# Patient Record
Sex: Male | Born: 1990
Health system: Southern US, Community
[De-identification: ages and names within clinical notes are randomized; demographics above are authoritative.]

## PROBLEM LIST (undated history)

## (undated) DIAGNOSIS — K529 Noninfective gastroenteritis and colitis, unspecified: Secondary | ICD-10-CM

## (undated) DIAGNOSIS — F32A Depression, unspecified: Secondary | ICD-10-CM

## (undated) DIAGNOSIS — G4730 Sleep apnea, unspecified: Secondary | ICD-10-CM

## (undated) HISTORY — PX: NO PAST SURGERIES: SHX2092

---

## 2020-10-15 ENCOUNTER — Ambulatory Visit: Admit: 2020-10-15 | Discharge status: Against Medical Advice | Payer: Self-pay

## 2020-10-24 ENCOUNTER — Ambulatory Visit
Admission: EM | Admit: 2020-10-24 | Discharge: 2020-10-24 | Disposition: A | Discharge status: Home / Self Care | Payer: BC Managed Care – PPO

## 2020-10-24 ENCOUNTER — Other Ambulatory Visit: Payer: Self-pay

## 2020-10-24 ENCOUNTER — Ambulatory Visit (INDEPENDENT_AMBULATORY_CARE_PROVIDER_SITE_OTHER): Payer: BC Managed Care – PPO

## 2020-10-24 DIAGNOSIS — J069 Acute upper respiratory infection, unspecified: Secondary | ICD-10-CM

## 2020-10-24 DIAGNOSIS — H66002 Acute suppurative otitis media without spontaneous rupture of ear drum, left ear: Secondary | ICD-10-CM

## 2020-10-24 MED ORDER — AMOXICILLIN-POT CLAVULANATE 875-125 MG PO TABS: 1.0000 | ORAL_TABLET | Freq: Two times a day (BID) | ORAL | 0 refills | Status: AC

## 2020-10-24 MED ORDER — PROMETHAZINE-DM 6.25-15 MG/5ML PO SYRP: 5.0000 mL | ORAL_SOLUTION | Freq: Four times a day (QID) | ORAL | 0 refills | Status: DC | PRN

## 2020-10-24 NOTE — ED Provider Notes (Signed)
[facility]    CSN: [id number] Arrival date & time: 10/24/20  1827      History   Chief Complaint Chief Complaint  Patient presents with  . Cough    HPI Bradley Ruiz is a 29 y.o. male.   29 year old male here for evaluation of cough, sneezing, runny nose, and nasal congestion.  Patient reports that his symptoms have been going on for the past 10 days.  He denies fever, nausea, vomiting, diarrhea, or shortness of breath.  He has been around someone with similar symptoms which was his child.  He reports that his cough is dry but it is to the point where he is waking everybody up at night with his coughing.  He reports that he has had some wheezing as well.  Patient was tested for Covid earlier in the week and was negative.  He has been fully vaccinated against Covid.     History reviewed. No pertinent past medical history.  There are no problems to display for this patient.   Past Surgical History:  Procedure Laterality Date  . NO PAST SURGERIES         Home Medications    Prior to Admission medications   Medication Sig Start Date End Date Taking? Authorizing Provider  FLUoxetine (PROZAC) 20 MG capsule Take 20 mg by mouth daily. 10/01/20  Yes [provider]  amoxicillin-clavulanate (AUGMENTIN) 875-125 MG tablet Take 1 tablet by mouth every 12 (twelve) hours for 10 days. 10/24/20 11/03/20  Becky Augusta, NP  promethazine-dextromethorphan (PROMETHAZINE-DM) 6.25-15 MG/5ML syrup Take 5 mLs by mouth 4 (four) times daily as needed. 10/24/20   Becky Augusta, NP    Family History Family History  Problem Relation Age of Onset  . Hypertension Father     Social History Social History   Tobacco Use  . Smoking status: Never Smoker  . Smokeless tobacco: Never Used  Vaping Use  . Vaping Use: Never used  Substance Use Topics  . Alcohol use: Yes    Comment: rare  . Drug use: Never     Allergies   Patient has no known allergies.   Review of  Systems Review of Systems  Constitutional: Negative for activity change, appetite change and fever.  HENT: Positive for rhinorrhea and sneezing. Negative for congestion, ear discharge, ear pain, sinus pressure, sinus pain and sore throat.   Respiratory: Positive for cough and wheezing. Negative for shortness of breath.   Cardiovascular: Negative for chest pain.  Gastrointestinal: Negative for diarrhea and vomiting.  Musculoskeletal: Negative for arthralgias and myalgias.  Neurological: Negative for headaches.  Hematological: Negative.   Psychiatric/Behavioral: Negative.      Physical Exam Triage Vital Signs ED Triage Vitals  Enc Vitals Group     BP 10/24/20 1901 133/82     Pulse Rate 10/24/20 1901 76     Resp 10/24/20 1901 19     Temp 10/24/20 1901 98.1 F (36.7 C)     Temp Source 10/24/20 1901 Oral     SpO2 10/24/20 1901 99 %     Weight 10/24/20 1857 (!) 400 lb (181.4 kg)     Height 10/24/20 1857 6\' 2"  (1.88 m)     Head Circumference --      Peak Flow --      Pain Score 10/24/20 1857 0     Pain Loc --      Pain Edu? --      Excl. in [place]? --    No data found.  Updated Vital Signs BP 133/82 (BP Location: Right Arm)   Pulse 76   Temp 98.1 F (36.7 C) (Oral)   Resp 19   Ht 6\' 2"  (1.88 m)   Wt (!) 400 lb (181.4 kg)   SpO2 99%   BMI 51.36 kg/m   Visual Acuity Right Eye Distance:   Left Eye Distance:   Bilateral Distance:    Right Eye Near:   Left Eye Near:    Bilateral Near:     Physical Exam Vitals and nursing note reviewed.  Constitutional:      General: He is not in acute distress.    Appearance: Normal appearance. He is obese. He is not toxic-appearing.  HENT:     Head: Normocephalic and atraumatic.     Right Ear: Tympanic membrane, ear canal and external ear normal.     Left Ear: Ear canal and external ear normal.     Ears:     Comments: Left tympanic membrane is erythematous and injected.    Nose: Congestion and rhinorrhea present.     Comments:  Nasal mucosa have mild erythema and edema with clear nasal discharge.    Mouth/Throat:     Mouth: Mucous membranes are moist.     Pharynx: Posterior oropharyngeal erythema present. No oropharyngeal exudate.     Comments: Posterior oropharynx has mild erythema and clear postnasal drip. Eyes:     General: No scleral icterus.    Extraocular Movements: Extraocular movements intact.     Conjunctiva/sclera: Conjunctivae normal.     Pupils: Pupils are equal, round, and reactive to light.  Cardiovascular:     Rate and Rhythm: Normal rate and regular rhythm.     Pulses: Normal pulses.     Heart sounds: Normal heart sounds. No murmur heard.  No gallop.   Pulmonary:     Effort: Pulmonary effort is normal.     Breath sounds: Normal breath sounds. No wheezing or rales.  Musculoskeletal:        General: No tenderness. Normal range of motion.     Cervical back: Normal range of motion and neck supple. No tenderness.  Skin:    General: Skin is warm and dry.     Capillary Refill: Capillary refill takes less than 2 seconds.     Findings: No erythema or rash.  Neurological:     General: No focal deficit present.     Mental Status: He is alert and oriented to person, place, and time.  Psychiatric:        Mood and Affect: Mood normal.        Behavior: Behavior normal.        Thought Content: Thought content normal.        Judgment: Judgment normal.      UC Treatments / Results  Labs (all labs ordered are listed, but only abnormal results are displayed) Labs Reviewed - No data to display  EKG   Radiology DG Chest 2 View  Result Date: 10/24/2020 CLINICAL DATA:  Cough 1 week EXAM: CHEST - 2 VIEW COMPARISON:  None. FINDINGS: The heart size and mediastinal contours are within normal limits. Both lungs are clear. The visualized skeletal structures are unremarkable. IMPRESSION: No active cardiopulmonary disease. Electronically Signed   By: 13/04/2020 M.D.   On: 10/24/2020 19:21     Procedures Procedures (including critical care time)  Medications Ordered in UC Medications - No data to display  Initial Impression / Assessment and Plan / UC Course  I  have reviewed the triage vital signs and the nursing notes.  Pertinent labs & imaging results that were available during my care of the patient were reviewed by me and considered in my medical decision making (see chart for details).   Patient is here for evaluation of a cough and cold symptoms that he had for the past 10 days.  He says he is feeling better and his major problem is his cough.  He says his cough is so violent at night that it wakes the rest of his family up.  Physical exam reveals some mildly erythematous nasal mucosa with clear nasal discharge and clear postnasal drip.  His left tympanic membrane is erythematous and injected.  Will treat for otitis media with Augmentin twice daily x10 days.  Lung sounds are clear to auscultation.  Will give Promethazine DM for cough and congestion at nighttime.  Patient can follow-up with his primary care provider if his symptoms are not improving.  Nursing order a chest x-ray which was negative.   Final Clinical Impressions(s) / UC Diagnoses   Final diagnoses:  Viral URI with cough  Non-recurrent acute suppurative otitis media of left ear without spontaneous rupture of tympanic membrane     Discharge Instructions     Take the Augmentin twice daily for 10 days.  Take it with food to treat your ear infection.  Use the Promethazine DM every 6 hours as needed for cough.  This will make you drowsy so I suggest you save it for bedtime.  Increase your oral fluid intake to keep your mucus nice and thin.  If your symptoms continue follow-up with your primary care provider.    ED Prescriptions    Medication Sig Dispense Auth. Provider   amoxicillin-clavulanate (AUGMENTIN) 875-125 MG tablet Take 1 tablet by mouth every 12 (twelve) hours for 10 days. 20 tablet Becky Augusta, NP   promethazine-dextromethorphan (PROMETHAZINE-DM) 6.25-15 MG/5ML syrup Take 5 mLs by mouth 4 (four) times daily as needed. 118 mL Becky Augusta, NP     PDMP not reviewed this encounter.   Becky Augusta, NP 10/24/20 1931

## 2020-10-24 NOTE — Discharge Instructions (Addendum)
Take the Augmentin twice daily for 10 days.  Take it with food to treat your ear infection.  Use the Promethazine DM every 6 hours as needed for cough.  This will make you drowsy so I suggest you save it for bedtime.  Increase your oral fluid intake to keep your mucus nice and thin.  If your symptoms continue follow-up with your primary care provider.

## 2020-10-24 NOTE — ED Triage Notes (Signed)
Patient complains of cough, sneezing, runny nose, nasal congestion x 10 days. States that he was covid tested on Friday and was negative. States that he was worse and feels a little better now but feels some chest tightness when coughing.

## 2020-12-01 ENCOUNTER — Ambulatory Visit
Admission: EM | Admit: 2020-12-01 | Discharge: 2020-12-01 | Disposition: A | Discharge status: Home / Self Care | Payer: BC Managed Care – PPO | Attending: Emergency Medicine | Admitting: Emergency Medicine

## 2020-12-01 ENCOUNTER — Other Ambulatory Visit: Payer: Self-pay

## 2020-12-01 DIAGNOSIS — R059 Cough, unspecified: Secondary | ICD-10-CM

## 2020-12-01 MED ORDER — PROMETHAZINE-DM 6.25-15 MG/5ML PO SYRP: 5.0000 mL | ORAL_SOLUTION | Freq: Four times a day (QID) | ORAL | 0 refills | Status: DC | PRN

## 2020-12-01 MED ORDER — ALBUTEROL SULFATE HFA 108 (90 BASE) MCG/ACT IN AERS: 2.0000 | INHALATION_SPRAY | RESPIRATORY_TRACT | 0 refills | Status: DC | PRN

## 2020-12-01 MED ORDER — AEROCHAMBER MV MISC: 2 refills | Status: DC

## 2020-12-01 MED ORDER — BENZONATATE 100 MG PO CAPS: 200.0000 mg | ORAL_CAPSULE | Freq: Three times a day (TID) | ORAL | 0 refills | Status: DC

## 2020-12-01 MED ORDER — DOXYCYCLINE HYCLATE 100 MG PO CAPS: 100.0000 mg | ORAL_CAPSULE | Freq: Two times a day (BID) | ORAL | 0 refills | Status: DC

## 2020-12-01 NOTE — ED Triage Notes (Signed)
Patient states that he is here for ear pain that started around the end of October. Patient states that he was seen here on 11/5 and was rx'd Augmentin. States that his wife is deathly allergic to this and would not take it at home. States that he has been continuing to have pain and feels like when he breathes in he hears crackles.

## 2020-12-01 NOTE — Discharge Instructions (Addendum)
Take the doxycycline twice a day, with food, for 10 days.  Use the Tessalon Perles during the day as needed for cough and the Promethazine DM at bedtime.  Use the albuterol inhaler, 2 puffs with the spacer, every 4-6 hours as needed for cough and shortness of breath.  If your symptoms continue follow-up with your primary care provider.

## 2020-12-01 NOTE — ED Provider Notes (Signed)
[facility]    CSN: [id number] Arrival date & time: 12/01/20  1759      History   Chief Complaint Chief Complaint  Patient presents with  . Ear Pain    Left     HPI Bradley Ruiz is a 29 y.o. male.   HPI   29 year old male here for evaluation of continued cough.  Patient has had symptoms since the end of October.  He was evaluated here on November 5 and was diagnosed with left otitis media and a viral URI with cough.  Patient was prescribed Augmentin but he did not take it because his wife did not want him taking it at home because she is allergic to penicillin.  Patient denies ear pain or fever.  Patient states he does have some shortness of breath but nothing above his baseline.  Patient denies wheezing.  Patient has had both nasal and chest congestion.  History reviewed. No pertinent past medical history.  There are no problems to display for this patient.   Past Surgical History:  Procedure Laterality Date  . NO PAST SURGERIES         Home Medications    Prior to Admission medications   Medication Sig Start Date End Date Taking? Authorizing Provider  FLUoxetine (PROZAC) 20 MG capsule Take 20 mg by mouth daily. 10/01/20  Yes [provider]  albuterol (VENTOLIN HFA) 108 (90 Base) MCG/ACT inhaler Inhale 2 puffs into the lungs every 4 (four) hours as needed for wheezing or shortness of breath. 12/01/20   Becky Augusta, NP  benzonatate (TESSALON) 100 MG capsule Take 2 capsules (200 mg total) by mouth every 8 (eight) hours. 12/01/20   Becky Augusta, NP  doxycycline (VIBRAMYCIN) 100 MG capsule Take 1 capsule (100 mg total) by mouth 2 (two) times daily. 12/01/20   Becky Augusta, NP  promethazine-dextromethorphan (PROMETHAZINE-DM) 6.25-15 MG/5ML syrup Take 5 mLs by mouth 4 (four) times daily as needed. 12/01/20   Becky Augusta, NP  Spacer/Aero-Holding Deretha Emory (AEROCHAMBER MV) inhaler Use as instructed 12/01/20   Becky Augusta, NP    Family  History Family History  Problem Relation Age of Onset  . Hypertension Father     Social History Social History   Tobacco Use  . Smoking status: Never Smoker  . Smokeless tobacco: Never Used  Vaping Use  . Vaping Use: Never used  Substance Use Topics  . Alcohol use: Yes    Comment: rare  . Drug use: Never     Allergies   Patient has no known allergies.   Review of Systems Review of Systems  Constitutional: Negative for activity change, appetite change and fever.  HENT: Positive for congestion and rhinorrhea. Negative for ear pain, sinus pressure, sinus pain and sore throat.   Respiratory: Positive for cough and shortness of breath. Negative for wheezing.   Cardiovascular: Negative for chest pain.  Gastrointestinal: Negative for diarrhea, nausea and vomiting.  Musculoskeletal: Negative for arthralgias and myalgias.  Skin: Negative for rash.  Neurological: Negative for headaches.  Hematological: Negative.   Psychiatric/Behavioral: Negative.      Physical Exam Triage Vital Signs ED Triage Vitals  Enc Vitals Group     BP --      Pulse Rate 12/01/20 1943 (!) 105     Resp 12/01/20 1943 18     Temp 12/01/20 1943 98.2 F (36.8 C)     Temp Source 12/01/20 1943 Oral     SpO2 12/01/20 1943 98 %  Weight 12/01/20 1941 (!) 400 lb (181.4 kg)     Height 12/01/20 1941 6\' 2"  (1.88 m)     Head Circumference --      Peak Flow --      Pain Score 12/01/20 1941 4     Pain Loc --      Pain Edu? --      Excl. in GC? --    No data found.  Updated Vital Signs BP 134/90 (BP Location: Right Arm)   Pulse (!) 105   Temp 98.2 F (36.8 C) (Oral)   Resp 18   Ht 6\' 2"  (1.88 m)   Wt (!) 400 lb (181.4 kg)   SpO2 98%   BMI 51.36 kg/m   Visual Acuity Right Eye Distance:   Left Eye Distance:   Bilateral Distance:    Right Eye Near:   Left Eye Near:    Bilateral Near:     Physical Exam Vitals and nursing note reviewed.  Constitutional:      General: He is not in acute  distress.    Appearance: Normal appearance. He is obese. He is not toxic-appearing.  HENT:     Head: Normocephalic and atraumatic.     Right Ear: Ear canal and external ear normal.     Left Ear: Ear canal normal.     Ears:     Comments: Bilateral tympanic membranes are erythematous and injected.  No effusion noted.    Nose: Congestion and rhinorrhea present.     Comments: Mucosa is mildly erythematous and edematous with clear nasal discharge. Eyes:     General: No scleral icterus.    Extraocular Movements: Extraocular movements intact.     Conjunctiva/sclera: Conjunctivae normal.     Pupils: Pupils are equal, round, and reactive to light.  Cardiovascular:     Rate and Rhythm: Normal rate and regular rhythm.     Pulses: Normal pulses.     Heart sounds: Normal heart sounds. No murmur heard. No gallop.   Pulmonary:     Effort: Pulmonary effort is normal.     Breath sounds: Wheezing present. No rhonchi or rales.  Musculoskeletal:        General: No swelling or tenderness. Normal range of motion.     Cervical back: Normal range of motion and neck supple.  Lymphadenopathy:     Cervical: No cervical adenopathy.  Skin:    General: Skin is warm and dry.     Capillary Refill: Capillary refill takes less than 2 seconds.     Findings: No erythema or rash.  Neurological:     General: No focal deficit present.     Mental Status: He is alert and oriented to person, place, and time.  Psychiatric:        Mood and Affect: Mood normal.        Behavior: Behavior normal.        Thought Content: Thought content normal.        Judgment: Judgment normal.      UC Treatments / Results  Labs (all labs ordered are listed, but only abnormal results are displayed) Labs Reviewed - No data to display  EKG   Radiology No results found.  Procedures Procedures (including critical care time)  Medications Ordered in UC Medications - No data to display  Initial Impression / Assessment and Plan  / UC Course  I have reviewed the triage vital signs and the nursing notes.  Pertinent labs & imaging results that were  available during my care of the patient were reviewed by me and considered in my medical decision making (see chart for details).   Patient is here for evaluation of continued cough that is been going on for over 6 weeks.  Patient's bilateral tympanic membranes are erythematous and injected.  Patient's nasal mucosa is inflamed there is clear nasal discharge.  Patient is scattered expiratory wheezes but for the most part his lung sounds are clear to auscultation.  Patient had a negative chest x-ray at his last visit on October 24, 2020.  I am not electing to reimage his chest at this time.  Will treat with doxycycline 100 mg twice daily x10 days, Tessalon Perles, Promethazine DM, and albuterol inhaler with spacer to help with the wheezing.   Final Clinical Impressions(s) / UC Diagnoses   Final diagnoses:  Cough     Discharge Instructions     Take the doxycycline twice a day, with food, for 10 days.  Use the Tessalon Perles during the day as needed for cough and the Promethazine DM at bedtime.  Use the albuterol inhaler, 2 puffs with the spacer, every 4-6 hours as needed for cough and shortness of breath.  If your symptoms continue follow-up with your primary care provider.    ED Prescriptions    Medication Sig Dispense Auth. Provider   benzonatate (TESSALON) 100 MG capsule Take 2 capsules (200 mg total) by mouth every 8 (eight) hours. 21 capsule Becky Augusta, NP   promethazine-dextromethorphan (PROMETHAZINE-DM) 6.25-15 MG/5ML syrup Take 5 mLs by mouth 4 (four) times daily as needed. 118 mL Becky Augusta, NP   doxycycline (VIBRAMYCIN) 100 MG capsule Take 1 capsule (100 mg total) by mouth 2 (two) times daily. 20 capsule Becky Augusta, NP   albuterol (VENTOLIN HFA) 108 (90 Base) MCG/ACT inhaler Inhale 2 puffs into the lungs every 4 (four) hours as needed for wheezing or  shortness of breath. 18 g Becky Augusta, NP   Spacer/Aero-Holding Chambers (AEROCHAMBER MV) inhaler Use as instructed 1 each Becky Augusta, NP     PDMP not reviewed this encounter.   Becky Augusta, NP 12/01/20 2006

## 2021-11-01 ENCOUNTER — Encounter: Payer: Self-pay | Admitting: Emergency Medicine

## 2021-11-01 ENCOUNTER — Other Ambulatory Visit: Payer: Self-pay

## 2021-11-01 ENCOUNTER — Ambulatory Visit
Admission: EM | Admit: 2021-11-01 | Discharge: 2021-11-01 | Disposition: A | Discharge status: Home / Self Care | Payer: BC Managed Care – PPO | Attending: Physician Assistant | Admitting: Physician Assistant

## 2021-11-01 DIAGNOSIS — R197 Diarrhea, unspecified: Secondary | ICD-10-CM | POA: Diagnosis not present

## 2021-11-01 NOTE — ED Provider Notes (Signed)
[facility]    CSN: [id number] Arrival date & time: 11/01/21  1439      History   Chief Complaint Chief Complaint  Patient presents with   Diarrhea    HPI Bradley Ruiz is a 30 y.o. male presenting for 1 week history of watery diarrhea.  Patient reports 5-6 episodes a day.  He has some increased "gurgling" as well.  Patient says that he has had problems with his bowels for a while.  Reports he always has loose stools and its very rare that he has a formed stool.  He says he normally has about 2-3 episodes of loose stools a day but it is much more watery and frequent over the past week.  Patient reports that he started a new diet drinking protein shakes over the past couple of months and has lost 40 pounds.  Reports over the past week he has switched to a powdered form.  He says he switched back to the liquid drinks over the past couple of days but has not made a difference with the diarrhea.  Patient believes he may have IBS but has never received a diagnosis.  He cannot identify any trigger foods.  Denies any issues with dairy or gluten.  Patient denies any food allergies.  He denies any associated fevers, fatigue, abdominal pain, black or bloody stools.  No recent travel or antibiotic use.  Patient denies any personal history of IBD.  No family history of IBD or colon cancer or other GI illness.  Patient says he honestly does not go to the doctor.  He does not take any routine medicines.  He has no other complaints.  HPI  History reviewed. No pertinent past medical history.  There are no problems to display for this patient.   Past Surgical History:  Procedure Laterality Date   NO PAST SURGERIES         Home Medications    Prior to Admission medications   Medication Sig Start Date End Date Taking? Authorizing Provider  FLUoxetine (PROZAC) 20 MG capsule Take 20 mg by mouth daily. 10/01/20  Yes [provider]  albuterol (VENTOLIN HFA) 108 (90 Base)  MCG/ACT inhaler Inhale 2 puffs into the lungs every 4 (four) hours as needed for wheezing or shortness of breath. 12/01/20   Margarette Canada, NP  Spacer/Aero-Holding Josiah Lobo (AEROCHAMBER MV) inhaler Use as instructed 12/01/20   Margarette Canada, NP    Family History Family History  Problem Relation Age of Onset   Hypertension Father     Social History Social History   Tobacco Use   Smoking status: Never   Smokeless tobacco: Never  Vaping Use   Vaping Use: Never used  Substance Use Topics   Alcohol use: Yes    Comment: rare   Drug use: Never     Allergies   Patient has no known allergies.   Review of Systems Review of Systems  Constitutional:  Negative for appetite change, fatigue and fever.  Gastrointestinal:  Positive for diarrhea. Negative for abdominal distention, abdominal pain, anal bleeding, blood in stool, constipation, nausea, rectal pain and vomiting.  Neurological:  Negative for weakness.    Physical Exam Triage Vital Signs ED Triage Vitals  Enc Vitals Group     BP 11/01/21 1529 127/84     Pulse Rate 11/01/21 1529 76     Resp 11/01/21 1529 16     Temp 11/01/21 1529 98.2 F (36.8 C)     Temp Source 11/01/21 1529  Oral     SpO2 11/01/21 1529 98 %     Weight 11/01/21 1527 (!) 384 lb (174.2 kg)     Height 11/01/21 1527 6\' 2"  (1.88 m)     Head Circumference --      Peak Flow --      Pain Score 11/01/21 1527 0     Pain Loc --      Pain Edu? --      Excl. in [place]? --    No data found.  Updated Vital Signs BP 127/84 (BP Location: Right Arm)   Pulse 76   Temp 98.2 F (36.8 C) (Oral)   Resp 16   Ht 6\' 2"  (1.88 m)   Wt (!) 384 lb (174.2 kg)   SpO2 98%   BMI 49.30 kg/m      Physical Exam Vitals and nursing note reviewed.  Constitutional:      General: He is not in acute distress.    Appearance: Normal appearance. He is well-developed. He is obese. He is not ill-appearing.  HENT:     Head: Normocephalic and atraumatic.  Eyes:     General: No scleral  icterus.    Conjunctiva/sclera: Conjunctivae normal.  Cardiovascular:     Rate and Rhythm: Normal rate and regular rhythm.     Heart sounds: Normal heart sounds.  Pulmonary:     Effort: Pulmonary effort is normal. No respiratory distress.     Breath sounds: Normal breath sounds.  Abdominal:     Palpations: Abdomen is soft.     Tenderness: There is no abdominal tenderness. There is no guarding or rebound.  Musculoskeletal:     Cervical back: Neck supple.  Skin:    General: Skin is warm and dry.  Neurological:     General: No focal deficit present.     Mental Status: He is alert. Mental status is at baseline.     Motor: No weakness.     Coordination: Coordination normal.     Gait: Gait normal.  Psychiatric:        Mood and Affect: Mood normal.        Behavior: Behavior normal.        Thought Content: Thought content normal.     UC Treatments / Results  Labs (all labs ordered are listed, but only abnormal results are displayed) Labs Reviewed - No data to display  EKG   Radiology No results found.  Procedures Procedures (including critical care time)  Medications Ordered in UC Medications - No data to display  Initial Impression / Assessment and Plan / UC Course  I have reviewed the triage vital signs and the nursing notes.  Pertinent labs & imaging results that were available during my care of the patient were reviewed by me and considered in my medical decision making (see chart for details).  30 year old male presents for 1 week history of watery stools up to 5-6 times a day.  Personal history of loose stools for the majority of his life.  Vitals are stable.  Patient is overall well-appearing.  He has no abdominal tenderness, guarding or rebound.  Suspect patient symptoms likely due to IBS but would like him to see a specialist to rule out other possibilities including food allergies, IBS, IBD, infectious diarrhea or inflammatory diarrhea.  Patient agreeable to  this plan.  Placed a referral to GI specialist.  Advised probiotics, fiber, Imodium.  Reviewed return and ED precautions.   Final Clinical Impressions(s) / UC Diagnoses  Final diagnoses:  Diarrhea, unspecified type     Discharge Instructions      -I have placed a referral to GI specialist for you.  I think since you have had issues with diarrhea for a long time it is important for you to see a specialist to see if you need to have a colonoscopy, food allergy testing, or testing of the actual stool. - Symptoms are probably due to IBS but as we discussed, IBS is a diagnosis of exclusion so other things need to be looked at first. - I have printed some information regarding diarrhea as well as diet for IBS. - Increase rest and fluids. - Increase dietary fiber and consider probiotics. - Try to identify any trigger foods and avoid them. - Consider taking Imodium to help with the diarrhea. - If you develop fever, abdominal pain, dehydration, etc. you should be seen again before you follow-up with GI.   ED Prescriptions   None    PDMP not reviewed this encounter.   Shirlee Latch, PA-C 11/01/21 [phone]

## 2021-11-01 NOTE — Discharge Instructions (Addendum)
-  I have placed a referral to GI specialist for you.  I think since you have had issues with diarrhea for a long time it is important for you to see a specialist to see if you need to have a colonoscopy, food allergy testing, or testing of the actual stool. - Symptoms are probably due to IBS but as we discussed, IBS is a diagnosis of exclusion so other things need to be looked at first. - I have printed some information regarding diarrhea as well as diet for IBS. - Increase rest and fluids. - Increase dietary fiber and consider probiotics. - Try to identify any trigger foods and avoid them. - Consider taking Imodium to help with the diarrhea. - If you develop fever, abdominal pain, dehydration, etc. you should be seen again before you follow-up with GI.

## 2021-11-01 NOTE — ED Triage Notes (Signed)
Patient c/o diarrhea over a week.  Patient denies fevers.  Patient denies any cold symptoms. Patient denies N/V.

## 2021-12-08 ENCOUNTER — Other Ambulatory Visit: Payer: Self-pay

## 2021-12-08 ENCOUNTER — Ambulatory Visit
Admission: EM | Admit: 2021-12-08 | Discharge: 2021-12-08 | Disposition: A | Discharge status: Home / Self Care | Payer: BC Managed Care – PPO | Attending: Emergency Medicine | Admitting: Emergency Medicine

## 2021-12-08 DIAGNOSIS — M545 Low back pain, unspecified: Secondary | ICD-10-CM | POA: Diagnosis not present

## 2021-12-08 LAB — URINALYSIS, COMPLETE (UACMP) WITH MICROSCOPIC
Bilirubin Urine: NEGATIVE
Glucose, UA: NEGATIVE mg/dL
Hgb urine dipstick: NEGATIVE
Ketones, ur: NEGATIVE mg/dL
Leukocytes,Ua: NEGATIVE
Nitrite: NEGATIVE
Protein, ur: NEGATIVE mg/dL
Specific Gravity, Urine: >1.03 — ABNORMAL HIGH (ref 1.005–1.030)
pH: 5.5 (ref 5.0–8.0)

## 2021-12-08 MED ORDER — TIZANIDINE HCL 4 MG PO TABS: 4.0000 mg | ORAL_TABLET | Freq: Three times a day (TID) | ORAL | 0 refills | Status: DC | PRN

## 2021-12-08 MED ORDER — KETOROLAC TROMETHAMINE 60 MG/2ML IM SOLN
30.0000 mg | Freq: Once | INTRAMUSCULAR | Status: AC
  Administered 2021-12-08: 12:00:00 30 mg via INTRAMUSCULAR

## 2021-12-08 MED ORDER — IBUPROFEN 600 MG PO TABS: 600.0000 mg | ORAL_TABLET | Freq: Four times a day (QID) | ORAL | 0 refills | Status: DC | PRN

## 2021-12-08 MED ORDER — METHYLPREDNISOLONE 4 MG PO TBPK: ORAL_TABLET | Freq: Every day | ORAL | 0 refills | Status: DC

## 2021-12-08 MED ORDER — ACETAMINOPHEN 500 MG PO TABS
1000.0000 mg | ORAL_TABLET | Freq: Once | ORAL | Status: AC
  Administered 2021-12-08: 12:00:00 1000 mg via ORAL

## 2021-12-08 NOTE — ED Triage Notes (Signed)
Add. Notes: "Cold". Home COVID19 test "Negative". Some congestion, cough. All getting better. Also Back pain has been "flaring up since diagnosis of IBS".

## 2021-12-08 NOTE — ED Provider Notes (Signed)
HPI  SUBJECTIVE:  Bradley Ruiz is a 30 y.o. male who presents with 2 days of nonradiating right low back pain.  It was intermittent, but became constant today after bending forward to change his child's diaper.  He describes the pain as sharp and dull.  No fevers, trauma, change in his baseline lifting, although he is a [employer]at[employer] and does a lot of heavy lifting.  No radiation of the pain down both of his legs, saddle anesthesia, urinary or fecal incontinence, urinary retention, leg weakness, distal numbness or tingling, syncope, urinary complaints.  No antipyretic in the past 6 hours.  He has tried ibuprofen 400 mg once or twice a day with improvement in symptoms.  Symptoms are worse with movement, bending forward.  He has a past medical history of back injury 2 months ago, and a provisional diagnosis of IBS.  No history of nephrolithiasis.  Family history negative for nephrolithiasis.  PMD: None.  History reviewed. No pertinent past medical history.  Past Surgical History:  Procedure Laterality Date   NO PAST SURGERIES      Family History  Problem Relation Age of Onset   Hypertension Father     Social History   Tobacco Use   Smoking status: Never   Smokeless tobacco: Never  Vaping Use   Vaping Use: Never used  Substance Use Topics   Alcohol use: Yes    Comment: rare   Drug use: Never    No current facility-administered medications for this encounter.  Current Outpatient Medications:    FLUoxetine (PROZAC) 20 MG capsule, Take 20 mg by mouth daily., Disp: , Rfl:    ibuprofen (ADVIL) 600 MG tablet, Take 1 tablet (600 mg total) by mouth every 6 (six) hours as needed., Disp: 30 tablet, Rfl: 0   methylPREDNISolone (MEDROL DOSEPAK) 4 MG TBPK tablet, Take by mouth daily. Follow package instructions, Disp: 21 tablet, Rfl: 0   tiZANidine (ZANAFLEX) 4 MG tablet, Take 1 tablet (4 mg total) by mouth every 8 (eight) hours as needed for muscle spasms., Disp: 30 tablet, Rfl: 0   albuterol  (VENTOLIN HFA) 108 (90 Base) MCG/ACT inhaler, Inhale 2 puffs into the lungs every 4 (four) hours as needed for wheezing or shortness of breath., Disp: 18 g, Rfl: 0   Spacer/Aero-Holding Chambers (AEROCHAMBER MV) inhaler, Use as instructed, Disp: 1 each, Rfl: 2  No Known Allergies   ROS  As noted in HPI.   Physical Exam  BP 123/83 (BP Location: Left Arm)    Pulse (!) 118    Temp 97.9 F (36.6 C) (Oral)    Resp 20    Wt (!) 175.1 kg    SpO2 100%    BMI 49.56 kg/m   Constitutional: Well developed, well nourished, appears slightly uncomfortable Eyes:  EOMI, conjunctiva normal bilaterally HENT: Normocephalic, atraumatic,mucus membranes moist Respiratory: Normal inspiratory effort Cardiovascular: Normal rate GI: nondistended. No suprapubic, flank tenderness skin: No rash, skin intact Musculoskeletal: no CVAT.  No paralumbar tenderness, no muscle spasm. No bony L-spine, SI joint tenderness. Bilateral lower extremities nontender, baseline ROM with intact PT pulses.  Pain with hip flexion against resistance, right more than left.  No pain with int/ext rotation , extension hips bilaterally. SLR neg bilaterally. Sensation baseline light touch bilaterally for Pt, DTR's symmetric and intact bilaterally KJ,  Motor symmetric bilateral 5/5 hip flexion, quadriceps, hamstrings, EHL, foot dorsiflexion, foot plantarflexion, gait somewhat antalgic but without apparent new ataxia. Neurologic: Alert & oriented x 3, no focal neuro deficits Psychiatric:  Speech and behavior appropriate   ED Course   Medications  acetaminophen (TYLENOL) tablet 1,000 mg (1,000 mg Oral Given 12/08/21 1156)  ketorolac (TORADOL) injection 30 mg (30 mg Intramuscular Given 12/08/21 1156)    Orders Placed This Encounter  Procedures   Urinalysis, Complete w Microscopic Urine, Clean Catch    Standing Status:   Standing    Number of Occurrences:   1    Results for orders placed or performed during the hospital encounter of  12/08/21 (from the past 24 hour(s))  Urinalysis, Complete w Microscopic Urine, Clean Catch     Status: Abnormal   Collection Time: 12/08/21 11:54 AM  Result Value Ref Range   Color, Urine YELLOW YELLOW   APPearance CLEAR CLEAR   Specific Gravity, Urine >1.030 (H) 1.005 - 1.030   pH 5.5 5.0 - 8.0   Glucose, UA NEGATIVE NEGATIVE mg/dL   Hgb urine dipstick NEGATIVE NEGATIVE   Bilirubin Urine NEGATIVE NEGATIVE   Ketones, ur NEGATIVE NEGATIVE mg/dL   Protein, ur NEGATIVE NEGATIVE mg/dL   Nitrite NEGATIVE NEGATIVE   Leukocytes,Ua NEGATIVE NEGATIVE   Squamous Epithelial / LPF 0-5 0 - 5   WBC, UA 0-5 0 - 5 WBC/hpf   RBC / HPF 0-5 0 - 5 RBC/hpf   Bacteria, UA FEW (A) NONE SEEN   Mucus PRESENT    No results found.  ED Clinical Impression  1. Acute right-sided low back pain without sciatica     ED Assessment/Plan  Patient appears uncomfortable.  While he has no CVAT, suprapubic or flank tenderness, or urinary complaints, he is concerned about nephrolithiasis, so will check a urinalysis.  In the meantime, Toradol 30 mg IM x1 with Tylenol 1000 mg p.o.  Plan to send home with Medrol Dosepak, Zanaflex, Tylenol/ibuprofen.  Will provide primary care list and order assistance in finding a PMD.  Patient has no hematuria.  Doubt nephrolithiasis.  Presentation most consistent with a musculoskeletal cause. No evidence of spinal cord involvement based on H&P. has been < 6 week duration. No historical red flags as noted in HPI. No physical red flags such as fever, bony tenderness, lower extremity weakness, saddle anesthesia. Imaging not indicated at this time.   On reevaluation, patient states that he is starting to feel better.  Discussed labs, medical decision-making, and plan for follow-up with the patient.  Discussed signs and symptoms that should prompt return to the emergency department.  Patient agrees with plan.   Meds ordered this encounter  Medications   acetaminophen (TYLENOL) tablet  1,000 mg   ketorolac (TORADOL) injection 30 mg   ibuprofen (ADVIL) 600 MG tablet    Sig: Take 1 tablet (600 mg total) by mouth every 6 (six) hours as needed.    Dispense:  30 tablet    Refill:  0   tiZANidine (ZANAFLEX) 4 MG tablet    Sig: Take 1 tablet (4 mg total) by mouth every 8 (eight) hours as needed for muscle spasms.    Dispense:  30 tablet    Refill:  0   methylPREDNISolone (MEDROL DOSEPAK) 4 MG TBPK tablet    Sig: Take by mouth daily. Follow package instructions    Dispense:  21 tablet    Refill:  0    *This clinic note was created using Dragon dictation software. Therefore, there may be occasional mistakes despite careful proofreading.  ?     Domenick Gong, MD 12/09/21 [phone]

## 2021-12-08 NOTE — ED Triage Notes (Signed)
Patient is here today for "Back pain". Started "a few days ago". Previously dull ache, now more persistent. Changing diaper this am "for son" & felt sharp pain in back. Pain seems to be "Right lower back". No radiation of pain.

## 2021-12-08 NOTE — Discharge Instructions (Addendum)
Your urine had no blood in it.  I think this is more musculoskeletal rather than a kidney stone.  600 mg of ibuprofen combined with 1000 mg of Tylenol  3 or 4 times a day.  Zanaflex will help with muscle spasms, Medrol Dosepak will help with inflammation.  Many people find gentle stretching and deep tissue massage helpful.   Follow-up with a primary care physician of your choice ASAP, go to the ER for the signs and symptoms we discussed. Here is a list of primary care providers who are taking new patients:  Dr. Elizabeth Sauer [address] [place] [address] [phone]  [facility] at [facility] [address] [place], [place] [address] [phone]  [facility] [place] [address] [place] [address]  [phone]  [facility] [address] [place], [place] [address] [phone]  [facility] [address] [place]  [phone] [place], [place] [address]  Here are clinics/ other resources who will see you if you do not have insurance. Some have certain criteria that you must meet. Call them and find out what they are:  [facility]: [address], [place], [place] [address] Phone: [phone] Hours: First and Third Saturdays of each Month, 9 a.m. - 1 p.m.  [facility]: [address], [address] [place], [place] [address] Phone: [phone] Hours: Tuesday, 4 p.m. - 8 p.m. Thursday, 1 p.m. - 8 p.m. Wednesday, 9 a.m. - [facility] [address], [place], [place] [address] Phone: [phone] Pharmacy Phone Number: [phone] Dental Phone Number: [phone] [facility] Insurance Help: [phone]  Dental Hours: Monday - Thursday, 8 a.m. - 6 p.m.  [facility] [address] [place], [place] [address] Phone: [phone] Pharmacy Phone Number: [phone] [facility] Insurance Help: [phone]  [facility] [address] [place], [place] [address] Phone:  [phone] Pharmacy Phone Number: [phone] [facility] Insurance Help: [phone]  [facility] [address] [place], [place] [address] Phone: [phone] [facility] Insurance Help: [phone]   [facility] [address], [place], [place] [address] Phone: [phone]  Go to www.goodrx.com  or www.costplusdrugs.com to look up your medications. This will give you a list of where you can find your prescriptions at the most affordable prices. Or ask the pharmacist what the cash price is, or if they have any other discount programs available to help make your medication more affordable. This can be less expensive than what you would pay with insurance.

## 2021-12-29 ENCOUNTER — Other Ambulatory Visit: Payer: Self-pay

## 2021-12-30 ENCOUNTER — Encounter: Payer: Self-pay | Admitting: Gastroenterology

## 2021-12-30 ENCOUNTER — Ambulatory Visit (INDEPENDENT_AMBULATORY_CARE_PROVIDER_SITE_OTHER): Payer: BC Managed Care – PPO | Admitting: Gastroenterology

## 2021-12-30 ENCOUNTER — Other Ambulatory Visit: Payer: Self-pay

## 2021-12-30 VITALS — BP 119/81 | HR 81 | Temp 98.1°F | Ht 74.0 in | Wt >= 6400 oz

## 2021-12-30 DIAGNOSIS — K529 Noninfective gastroenteritis and colitis, unspecified: Secondary | ICD-10-CM

## 2021-12-30 MED ORDER — NA SULFATE-K SULFATE-MG SULF 17.5-3.13-1.6 GM/177ML PO SOLN: 354.0000 mL | Freq: Once | ORAL | 0 refills | Status: AC

## 2021-12-30 NOTE — Progress Notes (Signed)
Jonathon Bellows MD, MRCP(U.K) [address]  [place]  [place], [place] [address]  Main: [phone]  Fax: [phone]   Gastroenterology Consultation  Referring Provider:     Gretta Cool Primary Care Physician:  Patient, No Pcp Per (Inactive) Primary Gastroenterologist:  Dr. Jonathon Bellows  Reason for Consultation:     Diarrhea        HPI:   Bradley Ruiz is a 31 y.o. y/o male referred for diarrhea.  Was seen at the ER on 11/01/2021 with a 1 week history of watery diarrhea.  Appears that he has had 2 stools for a long time and after starting a new diet protein shake lost about 40 pounds.  After the ER visit was referred to see GI.  No labs were done in the ER. He states that since ER visit his diarrhea has improved but still has about 2 bowel movements a day which is very soft and sometimes watery.  He said this has been ongoing at that time back.  Never sought any evaluation.  He states has been taking Aleve 800 mg daily for his back pain for a few weeks.  Consumes artificial sugars and has not drinks.  No family history of Crohn's disease or ulcerative colitis.  He has lost 40 pounds of weight but has stabilized at this point of time.  Denies any night sweats.  Appetite has been normal.  History reviewed. No pertinent past medical history.  Past Surgical History:  Procedure Laterality Date   NO PAST SURGERIES      Prior to Admission medications   Medication Sig Start Date End Date Taking? Authorizing Provider  albuterol (VENTOLIN HFA) 108 (90 Base) MCG/ACT inhaler Inhale 2 puffs into the lungs every 4 (four) hours as needed for wheezing or shortness of breath. 12/01/20   Margarette Canada, NP  FLUoxetine (PROZAC) 10 MG capsule Take 1 capsule by mouth daily.    [provider]  ibuprofen (ADVIL) 600 MG tablet Take 1 tablet (600 mg total) by mouth every 6 (six) hours as needed. 12/08/21   Melynda Ripple, MD  methylPREDNISolone (MEDROL DOSEPAK) 4 MG TBPK tablet  Take by mouth daily. Follow package instructions 12/08/21   Melynda Ripple, MD  Spacer/Aero-Holding Chambers (AEROCHAMBER MV) inhaler Use as instructed 12/01/20   Margarette Canada, NP  tiZANidine (ZANAFLEX) 4 MG tablet Take 1 tablet (4 mg total) by mouth every 8 (eight) hours as needed for muscle spasms. 12/08/21   Melynda Ripple, MD    Family History  Problem Relation Age of Onset   Hypertension Father      Social History   Tobacco Use   Smoking status: Never   Smokeless tobacco: Never  Vaping Use   Vaping Use: Never used  Substance Use Topics   Alcohol use: Yes    Comment: rare   Drug use: Never    Allergies as of 12/30/2021 - Review Complete 12/30/2021  Allergen Reaction Noted   Other  01/06/2021    Review of Systems:    All systems reviewed and negative except where noted in HPI.   Physical Exam:  BP 119/81    Pulse 81    Temp 98.1 F (36.7 C) (Oral)    Ht 6\' 2"  (1.88 m)    Wt (!) 404 lb (183.3 kg)    BMI 51.87 kg/m  No LMP for male patient. Psych:  Alert and cooperative. Normal mood and affect. General:   Alert,  Well-developed, well-nourished, pleasant and  cooperative in NAD Head:  Normocephalic and atraumatic. Eyes:  Sclera clear, no icterus.   Conjunctiva pink. Ears:  Normal auditory acuity. Lungs:  Respirations even and unlabored.  Clear throughout to auscultation.   No wheezes, crackles, or rhonchi. No acute distress. Heart:  Regular rate and rhythm; no murmurs, clicks, rubs, or gallops. Abdomen:  Normal bowel sounds.  No bruits.  Soft, non-tender and non-distended without masses, hepatosplenomegaly or hernias noted.  No guarding or rebound tenderness.    Neurologic:  Alert and oriented x3;  grossly normal neurologically. Psych:  Alert and cooperative. Normal mood and affect.  Imaging Studies: No results found.  Assessment and Plan:   Bradley Ruiz is a 31 y.o. y/o male has been referred for chronic diarrhea.  Unintentional weight loss.   Differentials include irritable bowel syndrome with diarrhea versus osmotic diarrhea from artificial sugars and sweeteners in his hamstrings.  Main concern is the unintentional weight loss of 40 pounds  Plan 1.  Stool studies to rule out infection, celiac serology, fecal calprotectin. 2.  CRP CBC, CMP 3.  Stop all artificial sugars or sweeteners which can cause osmotic diarrhea. 4.  EGD and colonoscopy to evaluate further   I have discussed alternative options, risks & benefits,  which include, but are not limited to, bleeding, infection, perforation,respiratory complication & drug reaction.  The patient agrees with this plan & written consent will be obtained.     Follow up in 8 weeks  Dr Jonathon Bellows MD,MRCP(U.K)

## 2022-01-01 LAB — COMPREHENSIVE METABOLIC PANEL
ALT: 30 IU/L (ref 0–44)
AST: 25 IU/L (ref 0–40)
Albumin/Globulin Ratio: 1.3 (ref 1.2–2.2)
Albumin: 4 g/dL (ref 4.0–5.0)
Alkaline Phosphatase: 56 IU/L (ref 44–121)
BUN/Creatinine Ratio: 26 — ABNORMAL HIGH (ref 9–20)
BUN: 17 mg/dL (ref 6–20)
Bilirubin Total: 0.4 mg/dL (ref 0.0–1.2)
CO2: 25 mmol/L (ref 20–29)
Calcium: 9.2 mg/dL (ref 8.7–10.2)
Chloride: 101 mmol/L (ref 96–106)
Creatinine, Ser: 0.66 mg/dL — ABNORMAL LOW (ref 0.76–1.27)
Globulin, Total: 3 g/dL (ref 1.5–4.5)
Glucose: 86 mg/dL (ref 70–99)
Potassium: 4.5 mmol/L (ref 3.5–5.2)
Sodium: 139 mmol/L (ref 134–144)
Total Protein: 7 g/dL (ref 6.0–8.5)
eGFR: 128 mL/min/{1.73_m2} (ref >59)

## 2022-01-01 LAB — CBC WITH DIFFERENTIAL/PLATELET
Basophils Absolute: 0.1 10*3/uL (ref 0.0–0.2)
Basos: 1 %
EOS (ABSOLUTE): 0.2 10*3/uL (ref 0.0–0.4)
Eos: 3 %
Hematocrit: 39.5 % (ref 37.5–51.0)
Hemoglobin: 12.9 g/dL — ABNORMAL LOW (ref 13.0–17.7)
Immature Grans (Abs): 0 10*3/uL (ref 0.0–0.1)
Immature Granulocytes: 0 %
Lymphocytes Absolute: 2.2 10*3/uL (ref 0.7–3.1)
Lymphs: 27 %
MCH: 27 pg (ref 26.6–33.0)
MCHC: 32.7 g/dL (ref 31.5–35.7)
MCV: 83 fL (ref 79–97)
Monocytes Absolute: 0.9 10*3/uL (ref 0.1–0.9)
Monocytes: 11 %
Neutrophils Absolute: 4.7 10*3/uL (ref 1.4–7.0)
Neutrophils: 58 %
Platelets: 297 10*3/uL (ref 150–450)
RBC: 4.77 x10E6/uL (ref 4.14–5.80)
RDW: 14.2 % (ref 11.6–15.4)
WBC: 8.1 10*3/uL (ref 3.4–10.8)

## 2022-01-01 LAB — CELIAC DISEASE AB SCREEN W/RFX
Antigliadin Abs, IgA: 5 units (ref 0–19)
IgA/Immunoglobulin A, Serum: 294 mg/dL (ref 90–386)
Transglutaminase IgA: <2 U/mL (ref 0–3)

## 2022-01-01 LAB — C-REACTIVE PROTEIN: CRP: 14 mg/L — ABNORMAL HIGH (ref 0–10)

## 2022-01-01 LAB — TSH: TSH: 1.71 u[IU]/mL (ref 0.450–4.500)

## 2022-01-04 NOTE — Progress Notes (Signed)
CRP elevated suggesting inflammation - await stool test resuilts

## 2022-01-05 ENCOUNTER — Telehealth: Payer: Self-pay

## 2022-01-05 NOTE — Telephone Encounter (Signed)
Patient was contacted about his lab results being elevated suggesting inflammation. I told him that we are still awaiting stool test results.

## 2022-01-05 NOTE — Telephone Encounter (Signed)
-----   Message from Jonathon Bellows, MD sent at 01/04/2022 12:27 PM EST ----- CRP elevated suggesting inflammation - await stool test resuilts

## 2022-01-07 ENCOUNTER — Other Ambulatory Visit: Payer: Self-pay | Admitting: Gastroenterology

## 2022-01-08 NOTE — Telephone Encounter (Signed)
Called patient and left him a voicemail not to forget to turn in his stool to [facility]. Hopefully he does.

## 2022-01-10 LAB — GI PROFILE, STOOL, PCR

## 2022-01-10 LAB — CALPROTECTIN, FECAL: Calprotectin, Fecal: 163 ug/g — ABNORMAL HIGH (ref 0–120)

## 2022-01-11 LAB — C DIFFICILE, CYTOTOXIN B

## 2022-01-11 LAB — C DIFFICILE TOXINS A+B W/RFLX: C difficile Toxins A+B, EIA: NEGATIVE

## 2022-01-13 ENCOUNTER — Other Ambulatory Visit: Payer: Self-pay

## 2022-01-13 ENCOUNTER — Encounter: Payer: Self-pay | Admitting: Gastroenterology

## 2022-01-13 DIAGNOSIS — K529 Noninfective gastroenteritis and colitis, unspecified: Secondary | ICD-10-CM

## 2022-01-13 MED ORDER — NA SULFATE-K SULFATE-MG SULF 17.5-3.13-1.6 GM/177ML PO SOLN: 354.0000 mL | Freq: Once | ORAL | 0 refills | Status: AC

## 2022-01-14 ENCOUNTER — Other Ambulatory Visit: Payer: Self-pay

## 2022-01-14 ENCOUNTER — Ambulatory Visit: Payer: BC Managed Care – PPO | Admitting: Registered Nurse

## 2022-01-14 ENCOUNTER — Ambulatory Visit
Admission: RE | Admit: 2022-01-14 | Discharge: 2022-01-14 | Disposition: A | Discharge status: Home / Self Care | Payer: BC Managed Care – PPO | Attending: Gastroenterology | Admitting: Gastroenterology

## 2022-01-14 ENCOUNTER — Encounter: Payer: Self-pay | Admitting: Gastroenterology

## 2022-01-14 ENCOUNTER — Encounter
Admission: RE | Disposition: A | Discharge status: Home / Self Care | Payer: Self-pay | Source: Home / Self Care | Attending: Gastroenterology

## 2022-01-14 DIAGNOSIS — R634 Abnormal weight loss: Secondary | ICD-10-CM | POA: Diagnosis not present

## 2022-01-14 DIAGNOSIS — R197 Diarrhea, unspecified: Secondary | ICD-10-CM | POA: Diagnosis present

## 2022-01-14 DIAGNOSIS — K529 Noninfective gastroenteritis and colitis, unspecified: Secondary | ICD-10-CM | POA: Diagnosis not present

## 2022-01-14 DIAGNOSIS — Z6841 Body Mass Index (BMI) 40.0 and over, adult: Secondary | ICD-10-CM | POA: Insufficient documentation

## 2022-01-14 HISTORY — DX: Sleep apnea, unspecified: G47.30

## 2022-01-14 HISTORY — DX: Morbid (severe) obesity due to excess calories: E66.01

## 2022-01-14 HISTORY — DX: Noninfective gastroenteritis and colitis, unspecified: K52.9

## 2022-01-14 HISTORY — PX: COLONOSCOPY WITH PROPOFOL: SHX5780

## 2022-01-14 HISTORY — PX: ESOPHAGOGASTRODUODENOSCOPY: SHX5428

## 2022-01-14 HISTORY — DX: Depression, unspecified: F32.A

## 2022-01-14 SURGERY — COLONOSCOPY WITH PROPOFOL
Anesthesia: General

## 2022-01-14 MED ORDER — PROPOFOL 500 MG/50ML IV EMUL
INTRAVENOUS | Status: DC | PRN
  Administered 2022-01-14: 140 ug/kg/min via INTRAVENOUS

## 2022-01-14 MED ORDER — DEXMEDETOMIDINE (PRECEDEX) IN NS 20 MCG/5ML (4 MCG/ML) IV SYRINGE
PREFILLED_SYRINGE | INTRAVENOUS | Status: DC | PRN
  Administered 2022-01-14: 8 ug via INTRAVENOUS
  Administered 2022-01-14: 12 ug via INTRAVENOUS

## 2022-01-14 MED ORDER — LIDOCAINE HCL (CARDIAC) PF 100 MG/5ML IV SOSY
PREFILLED_SYRINGE | INTRAVENOUS | Status: DC | PRN
  Administered 2022-01-14: 100 mg via INTRAVENOUS

## 2022-01-14 MED ORDER — PROPOFOL 500 MG/50ML IV EMUL: INTRAVENOUS | Status: DC | PRN

## 2022-01-14 MED ORDER — PROPOFOL 10 MG/ML IV BOLUS
INTRAVENOUS | Status: DC | PRN
  Administered 2022-01-14: 20 mg via INTRAVENOUS
  Administered 2022-01-14: 80 mg via INTRAVENOUS

## 2022-01-14 MED ORDER — SODIUM CHLORIDE 0.9 % IV SOLN: INTRAVENOUS | Status: DC

## 2022-01-14 NOTE — Op Note (Signed)
[facility] Gastroenterology Patient Name: Bradley Ruiz Procedure Date: 01/14/2022 10:17 AM MRN: [id number] Account #: [id number] Date of Birth: [birth year] Admit Type: Outpatient Age: 31 Room: [facility] ENDO ROOM 3 Gender: Male Note Status: Finalized Instrument Name: Park Meo [id number] Procedure:             Colonoscopy Indications:           Chronic diarrhea, Weight loss Providers:             Jonathon Bellows MD, MD Referring MD:          No Local Md, MD (Referring MD) Medicines:             Monitored Anesthesia Care Complications:         No immediate complications. Procedure:             Pre-Anesthesia Assessment:                        - Prior to the procedure, a History and Physical was                         performed, and patient medications, allergies and                         sensitivities were reviewed. The patient's tolerance                         of previous anesthesia was reviewed.                        - The risks and benefits of the procedure and the                         sedation options and risks were discussed with the                         patient. All questions were answered and informed                         consent was obtained.                        - ASA Grade Assessment: II - A patient with mild                         systemic disease.                        - ASA Grade Assessment: III - A patient with severe                         systemic disease.                        After obtaining informed consent, the colonoscope was                         passed under direct vision. Throughout the procedure,                         the patient's blood pressure,  pulse, and oxygen                         saturations were monitored continuously. The                         Colonoscope was introduced through the anus and                         advanced to the the terminal ileum. The colonoscopy                         was performed with  ease. The patient tolerated the                         procedure well. The quality of the bowel preparation                         was good. Findings:      The perianal and digital rectal examinations were normal.      The terminal ileum appeared normal. Biopsies were taken with a cold       forceps for histology.      The colon (entire examined portion) appeared normal. Biopsies were taken       with a cold forceps for histology.      The exam was otherwise without abnormality on direct and retroflexion       views. Impression:            - The examined portion of the ileum was normal.                         Biopsied.                        - The entire examined colon is normal. Biopsied.                        - The examination was otherwise normal on direct and                         retroflexion views. Recommendation:        - Discharge patient to home (with escort).                        - Resume previous diet.                        - Continue present medications.                        - Await pathology results.                        - Return to my office as previously scheduled. Procedure Code(s):     --- Professional ---                        [phone], Colonoscopy, flexible; with biopsy, single or                         multiple  Diagnosis Code(s):     --- Professional ---                        K52.9, Noninfective gastroenteritis and colitis,                         unspecified                        R63.4, Abnormal weight loss CPT copyright 2019 American Medical Association. All rights reserved. The codes documented in this report are preliminary and upon coder review may  be revised to meet current compliance requirements. Jonathon Bellows, MD Jonathon Bellows MD, MD 01/14/2022 10:49:48 AM This report has been signed electronically. Number of Addenda: 0 Note Initiated On: 01/14/2022 10:17 AM Scope Withdrawal Time: 0 hours 7 minutes 14 seconds  Total Procedure Duration: 0 hours  9 minutes 33 seconds  Estimated Blood Loss:  Estimated blood loss: none.      [facility]

## 2022-01-14 NOTE — Anesthesia Postprocedure Evaluation (Signed)
Anesthesia Post Note  Patient: Bradley Ruiz  Procedure(s) Performed: COLONOSCOPY WITH PROPOFOL ESOPHAGOGASTRODUODENOSCOPY (EGD)  Patient location during evaluation: PACU Anesthesia Type: General Level of consciousness: awake and alert, oriented and patient cooperative Pain management: pain level controlled Vital Signs Assessment: post-procedure vital signs reviewed and stable Respiratory status: spontaneous breathing, nonlabored ventilation and respiratory function stable Cardiovascular status: blood pressure returned to baseline and stable Postop Assessment: adequate PO intake Anesthetic complications: no   No notable events documented.   Last Vitals:  Vitals:   01/14/22 1007 01/14/22 1050  BP: 133/73 (!) 149/115  Pulse: 80 79  Resp:  (!) 26  Temp: (!) 36.1 C   SpO2: 99% 99%    Last Pain:  Vitals:   01/14/22 1007  TempSrc: Temporal  PainSc: 2                  Reed Breech

## 2022-01-14 NOTE — Transfer of Care (Signed)
Immediate Anesthesia Transfer of Care Note  Patient: Bradley Ruiz  Procedure(s) Performed: COLONOSCOPY WITH PROPOFOL ESOPHAGOGASTRODUODENOSCOPY (EGD)  Patient Location: PACU  Anesthesia Type:General  Level of Consciousness: awake and alert   Airway & Oxygen Therapy: Patient Spontanous Breathing and Patient connected to nasal cannula oxygen  Post-op Assessment: Report given to RN and Post -op Vital signs reviewed and stable  Post vital signs: Reviewed and stable  Last Vitals:  Vitals Value Taken Time  BP 149/115 01/14/22 1050  Temp    Pulse 79 01/14/22 1050  Resp 26 01/14/22 1050  SpO2 99 % 01/14/22 1050  Vitals shown include unvalidated device data.  Last Pain:  Vitals:   01/14/22 1007  TempSrc: Temporal  PainSc: 2          Complications: No notable events documented.

## 2022-01-14 NOTE — Op Note (Signed)
[facility] Patient Name: Bradley Ruiz Procedure Date: 01/14/2022 10:17 AM MRN: [id number] Account #: [id number] Date of Birth: 1991-01-10 Admit Type: Outpatient Age: 31 Room: [facility] ENDO ROOM 3 Gender: Male Note Status: Finalized Instrument Name: Upper Endoscope [id number] Procedure:             Upper GI endoscopy Indications:           Diarrhea, Weight loss Providers:             Wyline Mood MD, MD Referring MD:          No Local Md, MD (Referring MD) Medicines:             Monitored Anesthesia Care Complications:         No immediate complications. Procedure:             Pre-Anesthesia Assessment:                        - Prior to the procedure, a History and Physical was                         performed, and patient medications, allergies and                         sensitivities were reviewed. The patient's tolerance                         of previous anesthesia was reviewed.                        - The risks and benefits of the procedure and the                         sedation options and risks were discussed with the                         patient. All questions were answered and informed                         consent was obtained.                        - ASA Grade Assessment: III - A patient with severe                         systemic disease.                        After obtaining informed consent, the endoscope was                         passed under direct vision. Throughout the procedure,                         the patient's blood pressure, pulse, and oxygen                         saturations were monitored continuously. The Endoscope  was introduced through the mouth, and advanced to the                         third part of duodenum. The upper GI endoscopy was                         accomplished with ease. The patient tolerated the                         procedure well. Findings:      The  esophagus was normal.      The stomach was normal.      The cardia and gastric fundus were normal on retroflexion.      The examined duodenum was normal. Biopsies were taken with a cold       forceps for histology. Impression:            - Normal esophagus.                        - Normal stomach.                        - Normal examined duodenum. Biopsied. Recommendation:        - Await pathology results.                        - Perform a colonoscopy today. Procedure Code(s):     --- Professional ---                        [id number], Esophagogastroduodenoscopy, flexible,                         transoral; with biopsy, single or multiple Diagnosis Code(s):     --- Professional ---                        R19.7, Diarrhea, unspecified                        R63.4, Abnormal weight loss CPT copyright 2019 American Medical Association. All rights reserved. The codes documented in this report are preliminary and upon coder review may  be revised to meet current compliance requirements. Wyline Mood, MD Wyline Mood MD, MD 01/14/2022 10:35:35 AM This report has been signed electronically. Number of Addenda: 0 Note Initiated On: 01/14/2022 10:17 AM Estimated Blood Loss:  Estimated blood loss: none.      [employer] [facility] [employer]

## 2022-01-14 NOTE — H&P (Signed)
Jonathon Bellows, MD [address] [place], [place], [place], [address] [address], [place], [place], [place], [address] Phone: [phone]  Fax: [phone]  Primary Care Physician:  Patient, No Pcp Per (Inactive)   Pre-Procedure History & Physical: HPI:  Bradley Ruiz is a 31 y.o. male is here for an endoscopy and colonoscopy    Past Medical History:  Diagnosis Date   Chronic diarrhea    Depression    Sleep apnea     Past Surgical History:  Procedure Laterality Date   NO PAST SURGERIES      Prior to Admission medications   Medication Sig Start Date End Date Taking? Authorizing Provider  FLUoxetine (PROZAC) 10 MG capsule Take 1 capsule by mouth daily.   Yes [provider]  ibuprofen (ADVIL) 600 MG tablet Take 1 tablet (600 mg total) by mouth every 6 (six) hours as needed. 12/08/21  Yes Melynda Ripple, MD  albuterol (VENTOLIN HFA) 108 (90 Base) MCG/ACT inhaler Inhale 2 puffs into the lungs every 4 (four) hours as needed for wheezing or shortness of breath. 12/01/20   Margarette Canada, NP  methylPREDNISolone (MEDROL DOSEPAK) 4 MG TBPK tablet Take by mouth daily. Follow package instructions Patient not taking: Reported on 01/14/2022 12/08/21   Melynda Ripple, MD  Spacer/Aero-Holding Chambers (AEROCHAMBER MV) inhaler Use as instructed 12/01/20   Margarette Canada, NP  tiZANidine (ZANAFLEX) 4 MG tablet Take 1 tablet (4 mg total) by mouth every 8 (eight) hours as needed for muscle spasms. 12/08/21   Melynda Ripple, MD    Allergies as of 01/13/2022 - Review Complete 01/13/2022  Allergen Reaction Noted   Other  01/06/2021    Family History  Problem Relation Age of Onset   Hypertension Father     Social History   Socioeconomic History   Marital status: Married    Spouse name: Not on file   Number of children: Not on file   Years of education: Not on file   Highest education level: Not on file  Occupational History   Not on file  Tobacco Use    Smoking status: Never   Smokeless tobacco: Never  Vaping Use   Vaping Use: Never used  Substance and Sexual Activity   Alcohol use: Yes    Comment: occasionally   Drug use: Never   Sexual activity: Not on file  Other Topics Concern   Not on file  Social History Narrative   Not on file   Social Determinants of Health   Financial Resource Strain: Not on file  Food Insecurity: Not on file  Transportation Needs: Not on file  Physical Activity: Not on file  Stress: Not on file  Social Connections: Not on file  Intimate Partner Violence: Not on file    Review of Systems: See HPI, otherwise negative ROS  Physical Exam: BP 133/73    Pulse 80    Temp (!) 96.9 F (36.1 C) (Temporal)    Ht 6\' 2"  (1.88 m)    Wt (!) 172.8 kg    SpO2 99%    BMI 48.92 kg/m  General:   Alert,  pleasant and cooperative in NAD Head:  Normocephalic and atraumatic. Neck:  Supple; no masses or thyromegaly. Lungs:  Clear throughout to auscultation, normal respiratory effort.    Heart:  +S1, +S2, Regular rate and rhythm, No edema. Abdomen:  Soft, nontender and nondistended. Normal bowel sounds, without guarding, and without rebound.   Neurologic:  Alert and  oriented x4;  grossly normal neurologically.  Impression/Plan: Bradley Ruiz is here for an endoscopy and colonoscopy  to be performed for  evaluation of weight loss and diarrhea    Risks, benefits, limitations, and alternatives regarding endoscopy have been reviewed with the patient.  Questions have been answered.  All parties agreeable.   Jonathon Bellows, MD  01/14/2022, 10:13 AM

## 2022-01-14 NOTE — Anesthesia Preprocedure Evaluation (Signed)
Anesthesia Evaluation  Patient identified by MRN, date of birth, ID band Patient awake    Reviewed: Allergy & Precautions, NPO status , Patient's Chart, lab work & pertinent test results  History of Anesthesia Complications Negative for: history of anesthetic complications  Airway Mallampati: III   Neck ROM: Full    Dental no notable dental hx.    Pulmonary neg pulmonary ROS,    Pulmonary exam normal breath sounds clear to auscultation       Cardiovascular Exercise Tolerance: Good negative cardio ROS Normal cardiovascular exam Rhythm:Regular Rate:Normal     Neuro/Psych negative neurological ROS     GI/Hepatic negative GI ROS,   Endo/Other  Class 3 obesity  Renal/GU negative Renal ROS     Musculoskeletal   Abdominal   Peds  Hematology negative hematology ROS (+)   Anesthesia Other Findings   Reproductive/Obstetrics                             Anesthesia Physical Anesthesia Plan  ASA: 3  Anesthesia Plan: General   Post-op Pain Management:    Induction: Intravenous  PONV Risk Score and Plan: 2 and Propofol infusion, TIVA and Treatment may vary due to age or medical condition  Airway Management Planned: Natural Airway  Additional Equipment:   Intra-op Plan:   Post-operative Plan:   Informed Consent: I have reviewed the patients History and Physical, chart, labs and discussed the procedure including the risks, benefits and alternatives for the proposed anesthesia with the patient or authorized representative who has indicated his/her understanding and acceptance.       Plan Discussed with: CRNA  Anesthesia Plan Comments: (LMA/GETA backup discussed.  Patient consented for risks of anesthesia including but not limited to:  - adverse reactions to medications - damage to eyes, teeth, lips or other oral mucosa - nerve damage due to positioning  - sore throat or  hoarseness - damage to heart, brain, nerves, lungs, other parts of body or loss of life  Informed patient about role of CRNA in peri- and intra-operative care.  Patient voiced understanding.)        Anesthesia Quick Evaluation

## 2022-01-15 ENCOUNTER — Encounter: Payer: Self-pay | Admitting: Gastroenterology

## 2022-01-15 LAB — SURGICAL PATHOLOGY

## 2022-01-18 NOTE — Progress Notes (Signed)
Biopsies were normal - office visit to discuss how he is doing and next steps if still having issues

## 2022-01-20 ENCOUNTER — Telehealth: Payer: Self-pay

## 2022-01-20 NOTE — Telephone Encounter (Signed)
Called patient to let him know that his biopsy was normal and to come in to his follow up appointment. Patient understood and had no further questions.

## 2022-01-20 NOTE — Telephone Encounter (Signed)
-----   Message from Wyline Mood, MD sent at 01/18/2022 12:10 PM EST ----- Biopsies were normal - office visit to discuss how he is doing and next steps if still having issues

## 2022-02-03 ENCOUNTER — Ambulatory Visit (INDEPENDENT_AMBULATORY_CARE_PROVIDER_SITE_OTHER): Payer: BC Managed Care – PPO | Admitting: Gastroenterology

## 2022-02-03 ENCOUNTER — Other Ambulatory Visit: Payer: Self-pay

## 2022-02-03 ENCOUNTER — Encounter: Payer: Self-pay | Admitting: Gastroenterology

## 2022-02-03 VITALS — BP 131/83 | HR 80 | Temp 98.7°F | Wt 398.0 lb

## 2022-02-03 DIAGNOSIS — K2980 Duodenitis without bleeding: Secondary | ICD-10-CM | POA: Diagnosis not present

## 2022-02-03 DIAGNOSIS — K580 Irritable bowel syndrome with diarrhea: Secondary | ICD-10-CM

## 2022-02-03 DIAGNOSIS — Z791 Long term (current) use of non-steroidal anti-inflammatories (NSAID): Secondary | ICD-10-CM

## 2022-02-03 MED ORDER — RIFAXIMIN 550 MG PO TABS: 550.0000 mg | ORAL_TABLET | Freq: Two times a day (BID) | ORAL | 0 refills | Status: AC

## 2022-02-03 NOTE — Progress Notes (Signed)
°  °  Bradley Bellows MD, MRCP(U.K) [address]  [place]  [place], [place] [address]  Main: [phone]  Fax: [phone]   Primary Care Physician: Patient, No Pcp Per (Inactive)  Primary Gastroenterologist:  Dr. Jonathon Ruiz   Chief Complaint  Patient presents with   Diarrhea    HPI: Bradley Ruiz is a 31 y.o. male   Summary of history :  Initially recommended seen on 12/30/2021 for 1 week history of watery diarrhea after the ER visit his bowel movements had improved but does still very soft and sometimes watery.  Is been taking Aleve 800 mg for his back pain for a few weeks.  Consumes artificial sugars.  Lost 40 pounds of weight but has stabilized.  Interval history   12/30/2021-02/03/2022  01/07/2022: Stool testing for GI PCR was negative.  C. difficile toxin was negative.  CRP was elevated at 14. 01/14/2022 underwent a colonoscopy and EGD: Biopsies of the duodenum showed features of peptic duodenitis but no evidence of celiac disease.  Biopsies of the terminal ileum as well as random colon biopsies showed no abnormalities.  Still has issues with loose stools on and off.  Still takes Aleve up to 2 times a day.  States he has had GI issues all his life but started taking Aleve only recently.  Weight stable since last visit Current Outpatient Medications  Medication Sig Dispense Refill   FLUoxetine (PROZAC) 10 MG capsule Take 1 capsule by mouth daily.     No current facility-administered medications for this visit.    Allergies as of 02/03/2022 - Review Complete 02/03/2022  Allergen Reaction Noted   Other  01/06/2021    ROS:  General: Negative for anorexia, weight loss, fever, chills, fatigue, weakness. ENT: Negative for hoarseness, difficulty swallowing , nasal congestion. CV: Negative for chest pain, angina, palpitations, dyspnea on exertion, peripheral edema.  Respiratory: Negative for dyspnea at rest, dyspnea on exertion, cough, sputum, wheezing.  GI: See  history of present illness. GU:  Negative for dysuria, hematuria, urinary incontinence, urinary frequency, nocturnal urination.  Endo: Negative for unusual weight change.    Physical Examination:   BP 131/83    Pulse 80    Temp 98.7 F (37.1 C) (Oral)    Wt (!) 398 lb (180.5 kg)    BMI 51.10 kg/m   General: Well-nourished, well-developed in no acute distress.  Eyes: No icterus. Conjunctivae pink. Neuro: Alert and oriented x 3.  Grossly intact. Skin: Warm and dry, no jaundice.   Psych: Alert and cooperative, normal mood and affect.   Imaging Studies: No results found.  Assessment and Plan:   Bradley Ruiz is a 31 y.o. y/o male here to follow-up for chronic diarrhea.  Likely has IBS diarrhea.  Has peptic duodenitis likely secondary to early.   Plan 1.    Stop all artificial sugars or sweeteners which can cause osmotic diarrhea.  Stop use of Aleve as it can cause NSAID related colitis 2.  Trial of Xifaxan for IBS diarrhea 3.  High-fiber diet   Dr Bradley Bellows  MD,MRCP [facility]) Follow up as needed if symptoms do not improve

## 2022-02-20 ENCOUNTER — Other Ambulatory Visit: Payer: Self-pay | Admitting: Gastroenterology

## 2022-02-20 DIAGNOSIS — K580 Irritable bowel syndrome with diarrhea: Secondary | ICD-10-CM

## 2022-04-12 IMAGING — CR DG CHEST 2V
2 series · 2 of 2 positions shown · non-contrast
Comparison: None.

CLINICAL DATA: Cough 1 week

EXAM:
CHEST - 2 VIEW

[chest pa]
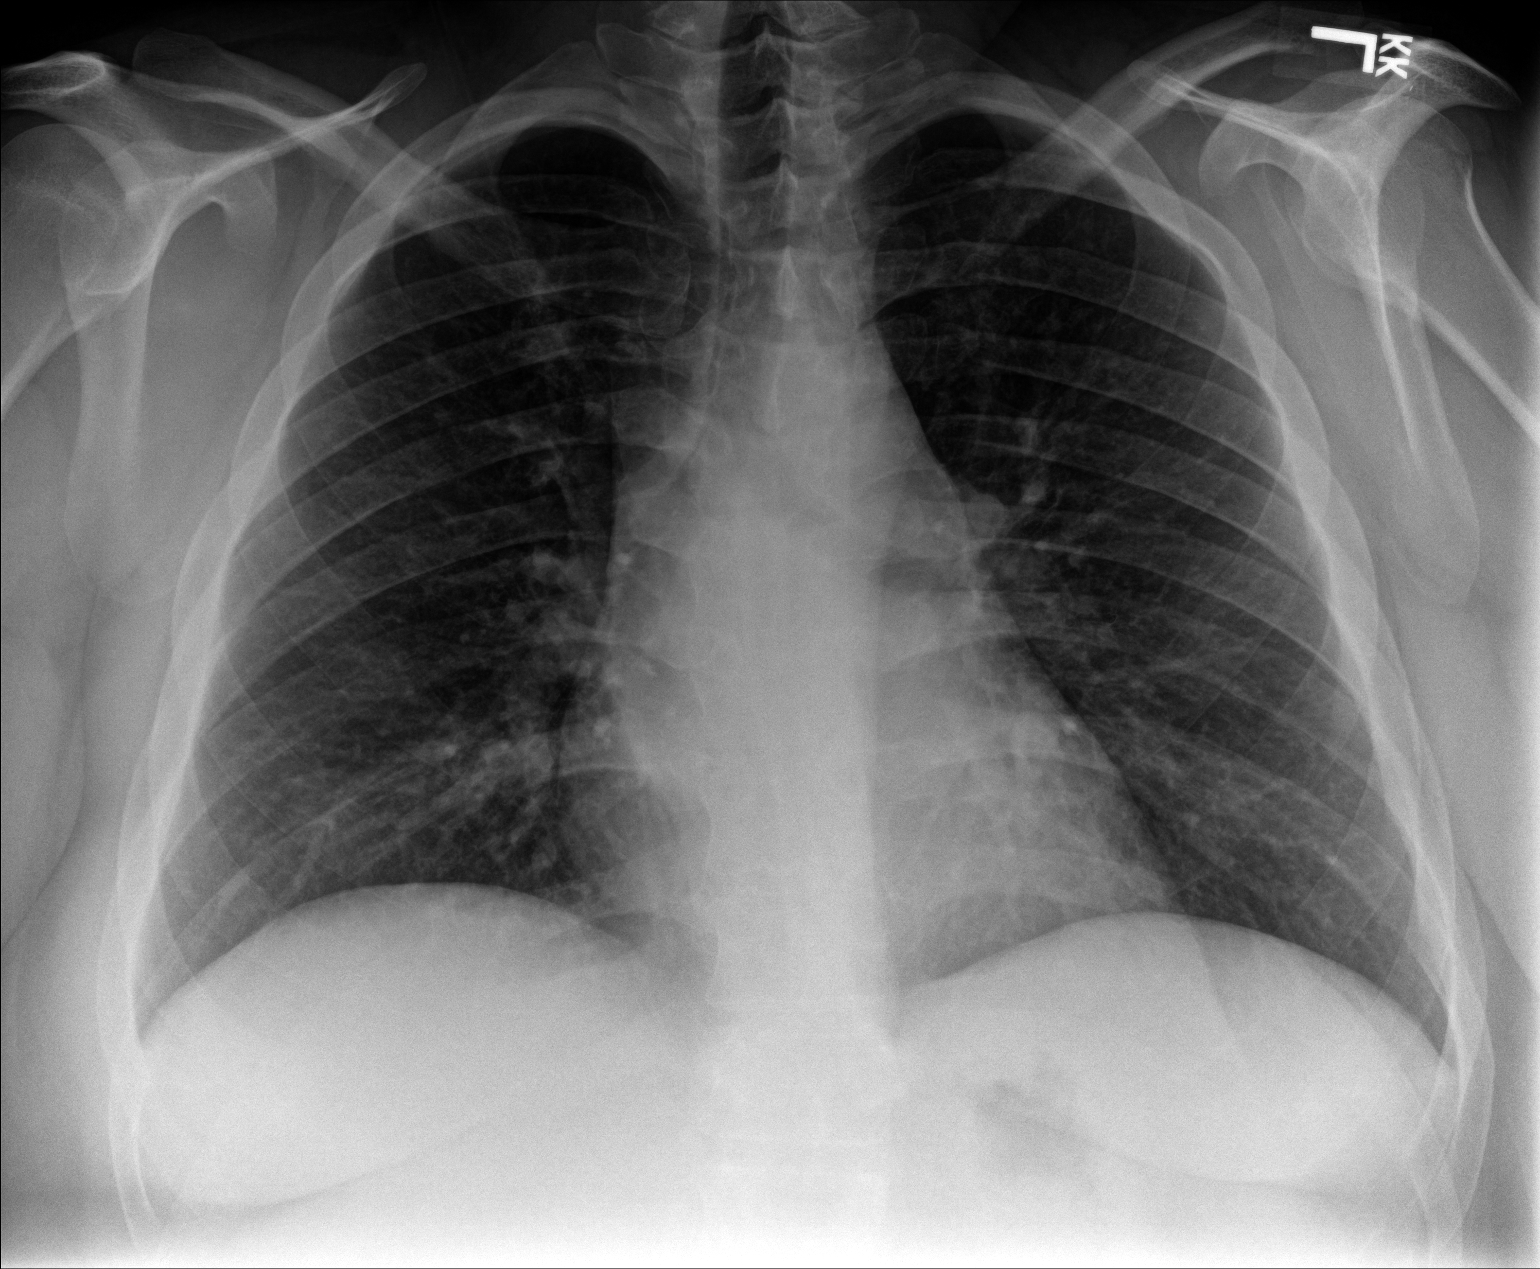

[chest lat]
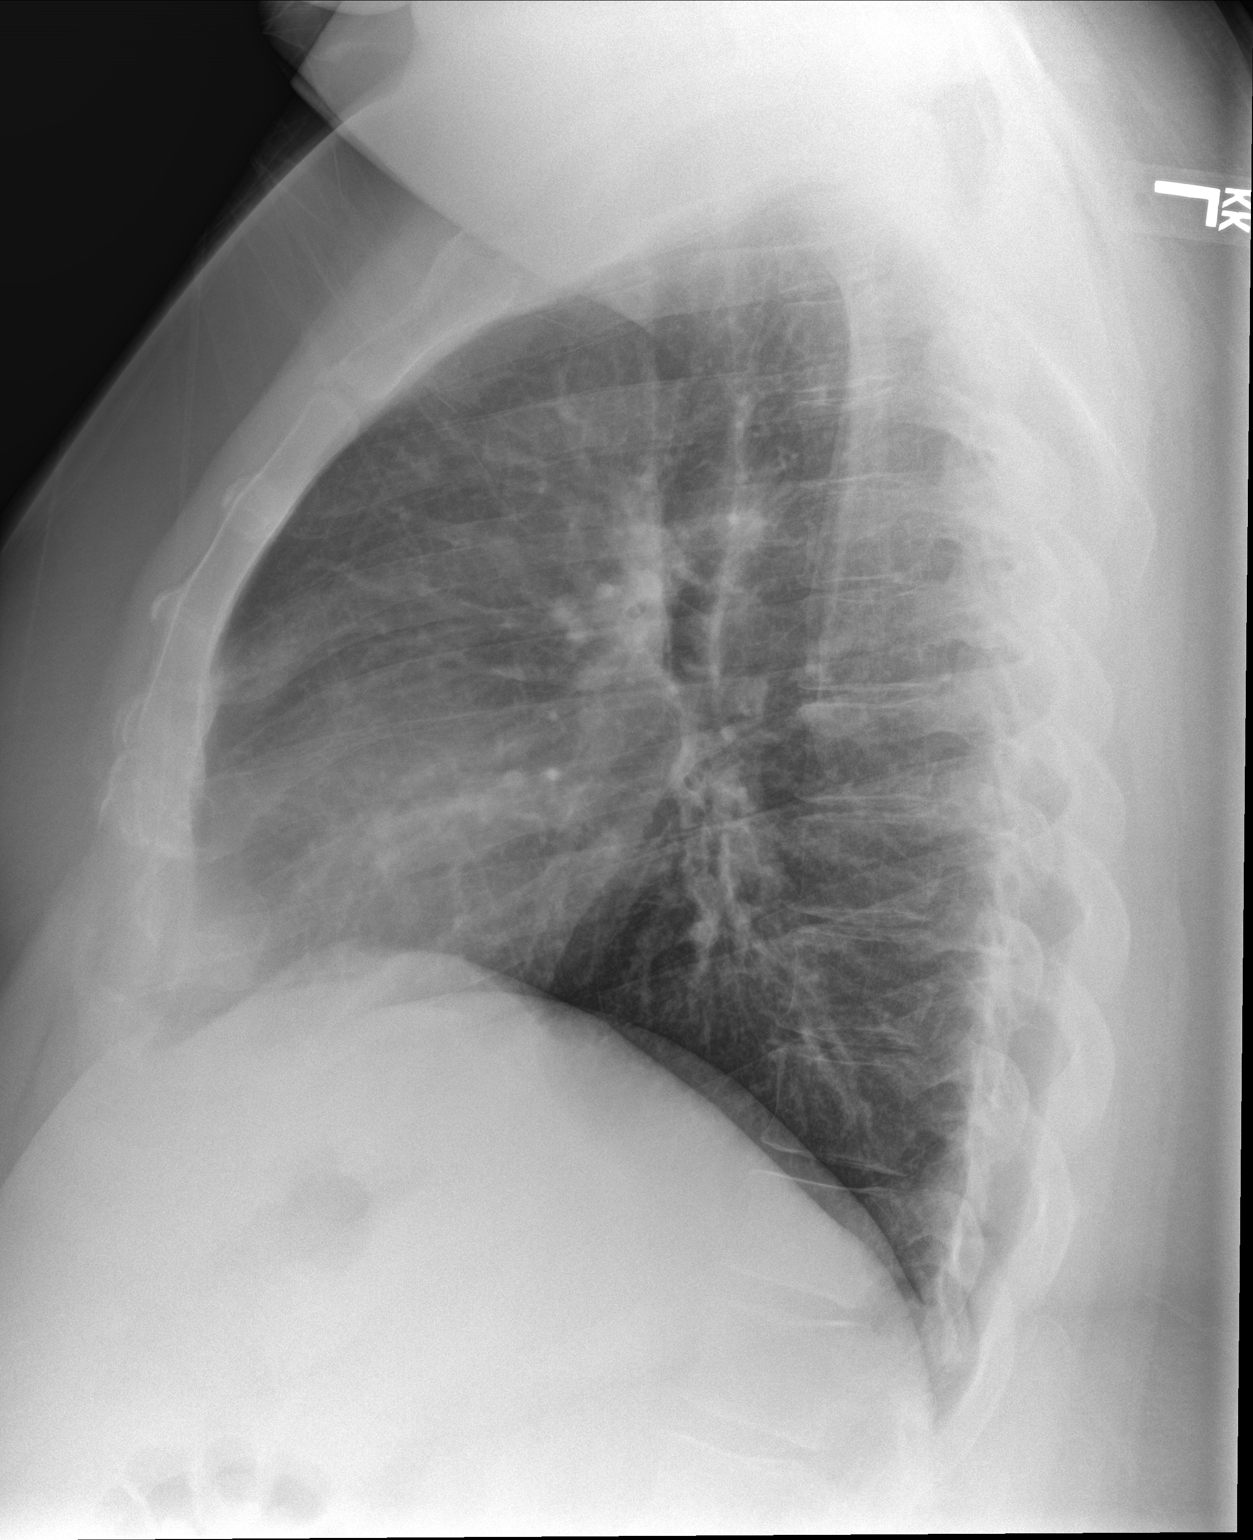

[2 of 2 positions shown; findings below may reference images not displayed]

FINDINGS: The heart size and mediastinal contours are within normal limits.
Both lungs are clear. The visualized skeletal structures are
unremarkable.
IMPRESSION: No active cardiopulmonary disease.

## 2022-11-12 ENCOUNTER — Encounter: Payer: Self-pay | Admitting: Emergency Medicine

## 2022-11-12 ENCOUNTER — Ambulatory Visit
Admission: EM | Admit: 2022-11-12 | Discharge: 2022-11-12 | Disposition: A | Discharge status: Home / Self Care | Payer: BC Managed Care – PPO

## 2022-11-12 DIAGNOSIS — M545 Low back pain, unspecified: Secondary | ICD-10-CM

## 2022-11-12 MED ORDER — TIZANIDINE HCL 4 MG PO TABS: 4.0000 mg | ORAL_TABLET | Freq: Three times a day (TID) | ORAL | 0 refills | Status: AC | PRN

## 2022-11-12 MED ORDER — KETOROLAC TROMETHAMINE 60 MG/2ML IM SOLN
60.0000 mg | Freq: Once | INTRAMUSCULAR | Status: AC
  Administered 2022-11-12: 60 mg via INTRAMUSCULAR

## 2022-11-12 MED ORDER — METHYLPREDNISOLONE 4 MG PO TBPK: ORAL_TABLET | ORAL | 0 refills | Status: AC

## 2022-11-12 NOTE — Discharge Instructions (Addendum)

## 2022-11-12 NOTE — ED Provider Notes (Signed)
[facility]    CSN: [id number] Arrival date & time: 11/12/22  0807      History   Chief Complaint Chief Complaint  Patient presents with   Back Pain    HPI Bradley Ruiz is a 31 y.o. male presenting for left lower back pain that has been ongoing for the past 3 days.  Pain is aching but becomes sharp with movement.  He denies any injury.  He says he noticed it after working.  He is a Curator.  He denies any specific injury though.  Pain is worse with sitting, leaning forward, extending back and twisting.  He says "I am fine when I am lying down."  The pain does not radiate and is not associate with any numbness, weakness or tingling.  He has taken high doses of ibuprofen and says that has not really been helpful.  He reports similar symptoms last year when he was treated here with muscle relaxers and corticosteroids.  States he got better within a couple days.  He denies any significant problems with his back in the past.  No previous back surgeries.  No report of leg weakness, loss of bowel or bladder control.  HPI  Past Medical History:  Diagnosis Date   Chronic diarrhea    Depression    Morbid obesity with BMI of 45.0-49.9, adult (HCC)    Sleep apnea     There are no problems to display for this patient.   Past Surgical History:  Procedure Laterality Date   COLONOSCOPY WITH PROPOFOL N/A 01/14/2022   Procedure: COLONOSCOPY WITH PROPOFOL;  Surgeon: Wyline Mood, MD;  [facility] ENDOSCOPY;  Service: Gastroenterology;  Laterality: N/A;   ESOPHAGOGASTRODUODENOSCOPY N/A 01/14/2022   Procedure: ESOPHAGOGASTRODUODENOSCOPY (EGD);  Surgeon: Wyline Mood, MD;  Location: [facility] ENDOSCOPY;  Service: Gastroenterology;  Laterality: N/A;   NO PAST SURGERIES         Home Medications    Prior to Admission medications   Medication Sig Start Date End Date Taking? Authorizing Provider  buPROPion (WELLBUTRIN XL) 150 MG 24 hr tablet Take 150 mg by mouth every morning.  11/02/22  Yes [provider]  methylPREDNISolone (MEDROL DOSEPAK) 4 MG TBPK tablet Take 6 tabs po on day 1 and decrease by 1 tab daily until complete 11/12/22  Yes Eusebio Friendly B, PA-C  tiZANidine (ZANAFLEX) 4 MG tablet Take 1 tablet (4 mg total) by mouth every 8 (eight) hours as needed for up to 7 days. 11/12/22 11/19/22 Yes Shirlee Latch, PA-C  FLUoxetine (PROZAC) 10 MG capsule Take 1 capsule by mouth daily.    [provider]    Family History Family History  Problem Relation Age of Onset   Hypertension Father     Social History Social History   Tobacco Use   Smoking status: Never   Smokeless tobacco: Never  Vaping Use   Vaping Use: Never used  Substance Use Topics   Alcohol use: Yes    Comment: occasionally   Drug use: Never     Allergies   Other   Review of Systems Review of Systems  Genitourinary:  Negative for difficulty urinating, dysuria and flank pain.  Musculoskeletal:  Positive for back pain. Negative for arthralgias and gait problem.  Neurological:  Negative for weakness and numbness.     Physical Exam Triage Vital Signs ED Triage Vitals  Enc Vitals Group     BP      Pulse      Resp  Temp      Temp src      SpO2      Weight      Height      Head Circumference      Peak Flow      Pain Score      Pain Loc      Pain Edu?      Excl. in [place]?    No data found.  Updated Vital Signs BP 119/84 (BP Location: Right Arm)   Pulse 85   Temp 97.6 F (36.4 C) (Oral)   Resp 16   Ht 6\' 2"  (1.88 m)   Wt (!) 397 lb 14.9 oz (180.5 kg)   SpO2 96%   BMI 51.09 kg/m    Physical Exam Vitals and nursing note reviewed.  Constitutional:      General: He is not in acute distress.    Appearance: Normal appearance. He is well-developed. He is obese. He is not ill-appearing.  HENT:     Head: Normocephalic and atraumatic.  Eyes:     General: No scleral icterus.    Conjunctiva/sclera: Conjunctivae normal.  Cardiovascular:     Rate  and Rhythm: Normal rate and regular rhythm.     Heart sounds: Normal heart sounds.  Pulmonary:     Effort: Pulmonary effort is normal. No respiratory distress.     Breath sounds: Normal breath sounds.  Musculoskeletal:     Cervical back: Neck supple.     Lumbar back: Tenderness (Minimal TTP left paralumbar muscles. He says "pain is deeper.") present. No bony tenderness. Decreased range of motion. Negative right straight leg raise test and negative left straight leg raise test.  Skin:    General: Skin is warm and dry.     Capillary Refill: Capillary refill takes less than 2 seconds.  Neurological:     General: No focal deficit present.     Mental Status: He is alert. Mental status is at baseline.     Motor: No weakness.     Gait: Gait normal.  Psychiatric:        Mood and Affect: Mood normal.        Behavior: Behavior normal.      UC Treatments / Results  Labs (all labs ordered are listed, but only abnormal results are displayed) Labs Reviewed - No data to display  EKG   Radiology No results found.  Procedures Procedures (including critical care time)  Medications Ordered in UC Medications  ketorolac (TORADOL) injection 60 mg (60 mg Intramuscular Given 11/12/22 0833)    Initial Impression / Assessment and Plan / UC Course  I have reviewed the triage vital signs and the nursing notes.  Pertinent labs & imaging results that were available during my care of the patient were reviewed by me and considered in my medical decision making (see chart for details).   31 year old male presents for left lower back pain x 2 to 3 days.  Denies any specific injury.  No red flag signs or symptoms.  Taking high doses of ibuprofen without much improvement in symptoms.  Vitals are stable.  He is overall well-appearing.  On exam he does look uncomfortable and is sitting straight up trying not to move.  He has minimal tenderness palpation of the left paralumbar vertebral muscles.  He says  the pain is "deeper."  Reduced flexion and extension as well as rotation of trunk due to pain and guarding.  Negative straight leg raise bilaterally.  Patient reports improving  with the treatment he received last year which was a tapered dose of Medrol and tizanidine.  He was also given a injection of ketorolac.  Reports he has not taken any medication today in hopes of receiving that injection.  Ordered 60 mg IM ketorolac in clinic.  Prescribed Medrol dose pack and tizanidine.  Advised he may also take Tylenol and use heat, ice, muscle rubs, lidocaine patches.  Reviewed supportive care and ED precautions for back pain.  Suspect most likely lumbar strain or potentially internal disc derangement so if condition worsens or does not improve in a few weeks he should follow-up with PCP or orthopedics.   Final Clinical Impressions(s) / UC Diagnoses   Final diagnoses:  Acute left-sided low back pain without sciatica     Discharge Instructions      BACK PAIN: Stressed avoiding painful activities . RICE (REST, ICE, COMPRESSION, ELEVATION) guidelines reviewed. May alternate ice and heat. Consider use of muscle rubs, Salonpas patches, etc. Use medications as directed including muscle relaxers if prescribed. Take anti-inflammatory medications as prescribed or OTC NSAIDs/Tylenol.  F/u with PCP in 7-10 days for reexamination, and please feel free to call or return to the urgent care at any time for any questions or concerns you may have and we will be happy to help you!   BACK PAIN RED FLAGS: If the back pain acutely worsens or there are any red flag symptoms such as numbness/tingling, leg weakness, saddle anesthesia, or loss of bowel/bladder control, go immediately to the ER. Follow up with [place] as scheduled or sooner if the pain does not begin to resolve or if it worsens before the follow up       ED Prescriptions     Medication Sig Dispense Auth. Provider   methylPREDNISolone (MEDROL DOSEPAK) 4 MG TBPK  tablet Take 6 tabs po on day 1 and decrease by 1 tab daily until complete 21 tablet Laurene Footman B, PA-C   tiZANidine (ZANAFLEX) 4 MG tablet Take 1 tablet (4 mg total) by mouth every 8 (eight) hours as needed for up to 7 days. 20 tablet Gretta Cool      PDMP not reviewed this encounter.   Danton Clap, PA-C 11/12/22 [phone]

## 2022-11-12 NOTE — ED Triage Notes (Addendum)
Pt c/o back pain. Started about 3 days ago. He states he hurt his back at work, but unsure how he injured it. He states the pain is mostly to the left. He states this is not a wrokers comp claim.
